# Patient Record
Sex: Male | Born: 2000 | Race: Black or African American | Hispanic: No | Marital: Single | Smoking: Never smoker
Health system: Southern US, Community
[De-identification: ages and names within clinical notes are randomized; demographics above are authoritative.]

---
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
## 2019-03-21 IMAGING — DX DG ANKLE COMPLETE 3+V*L*
3 series · 3 of 3 positions shown · non-contrast
Comparison: None.

CLINICAL DATA: Twisting injury with pain at the lateral malleolus

EXAM:
LEFT ANKLE COMPLETE - 3+ VIEW

[ankle ap]
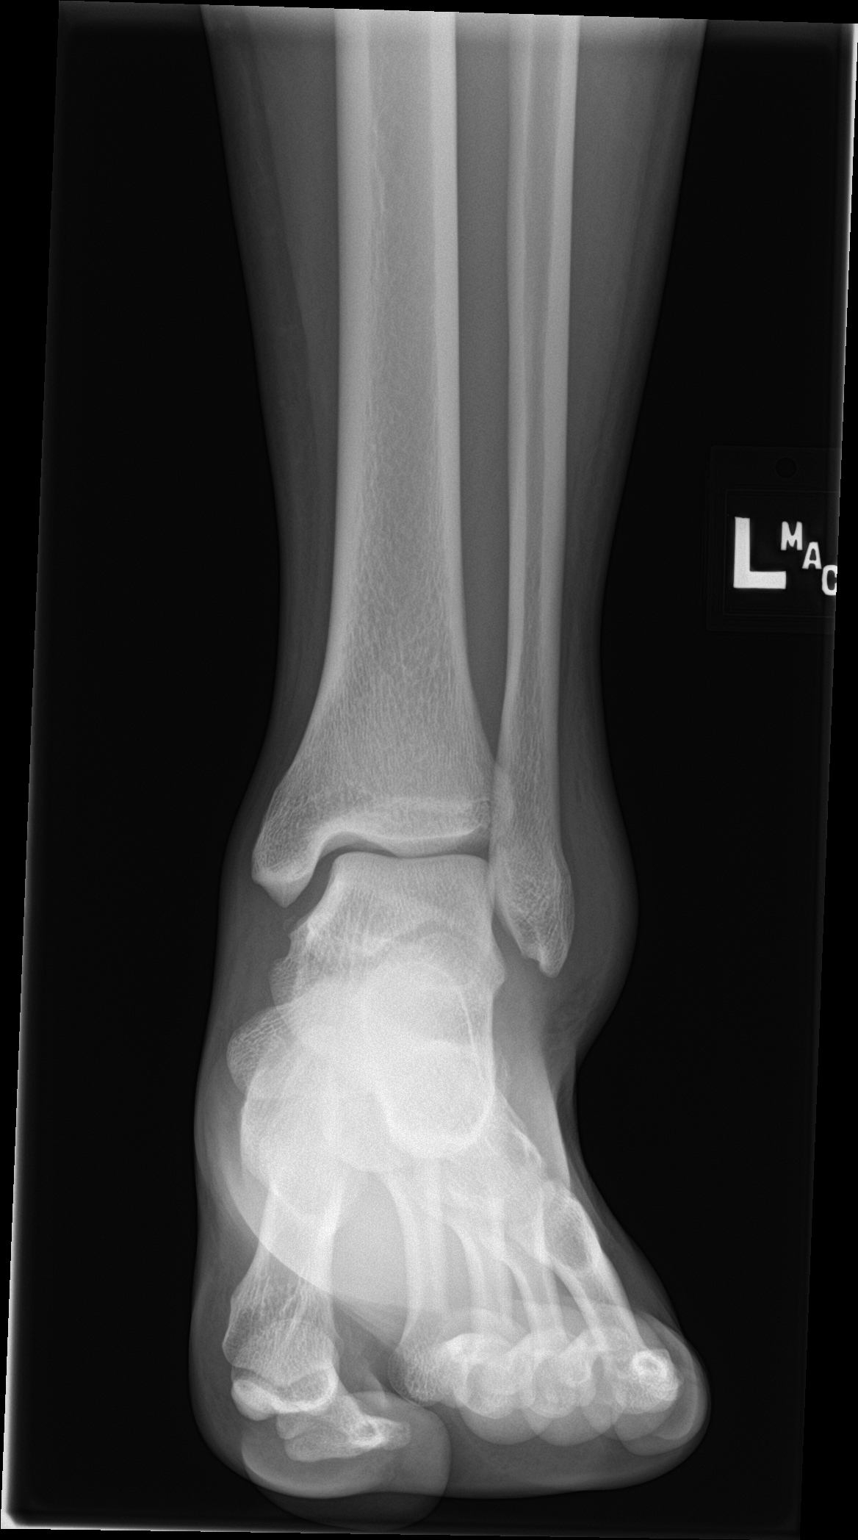

[ankle obl]
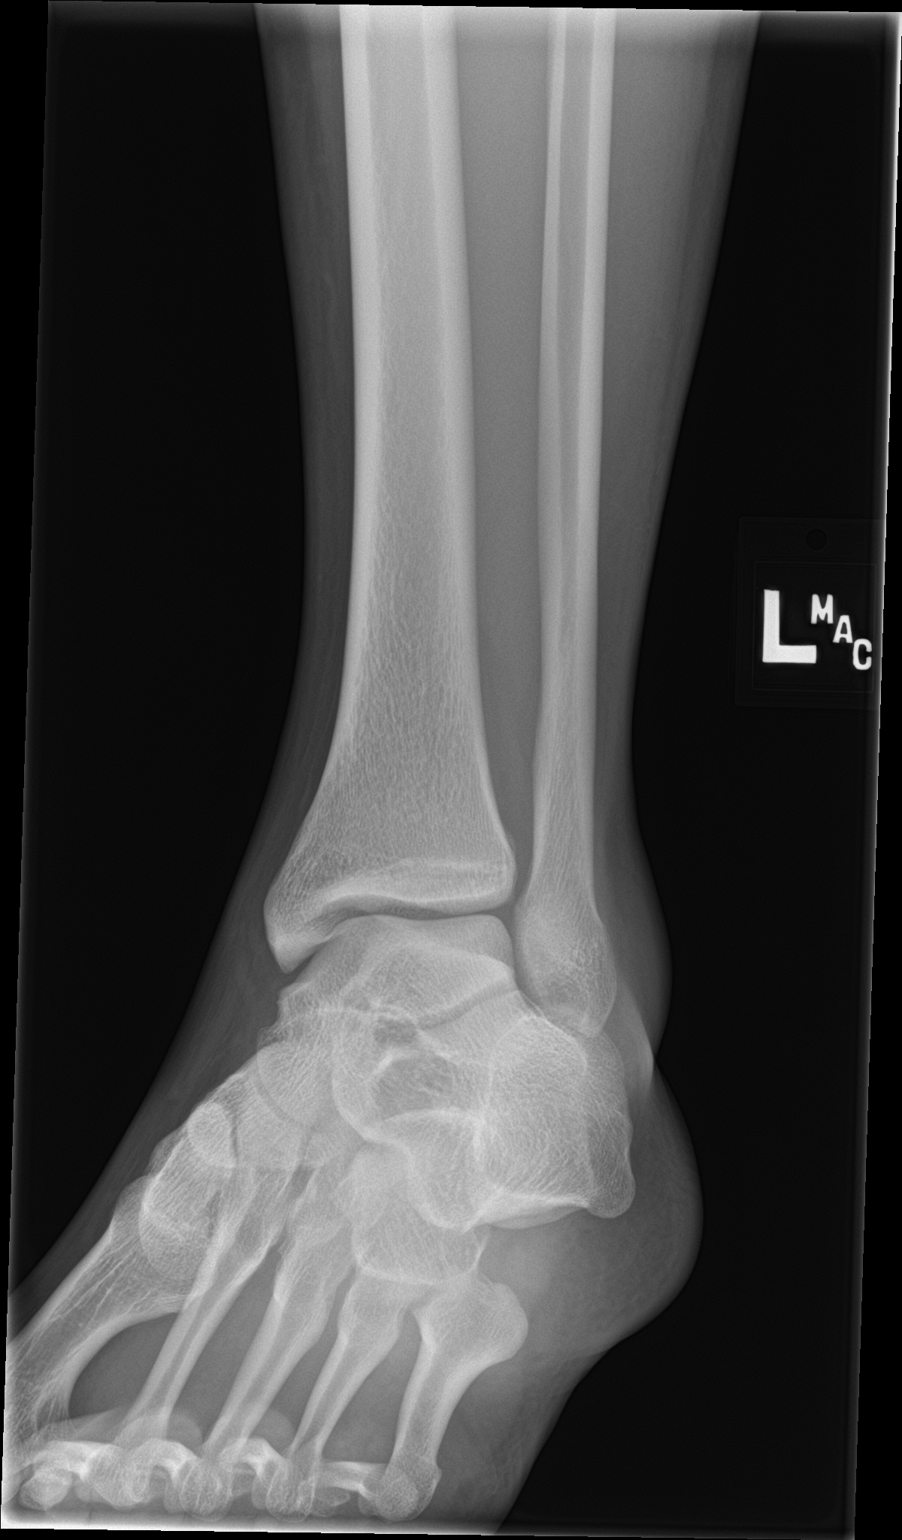

[ankle lat]
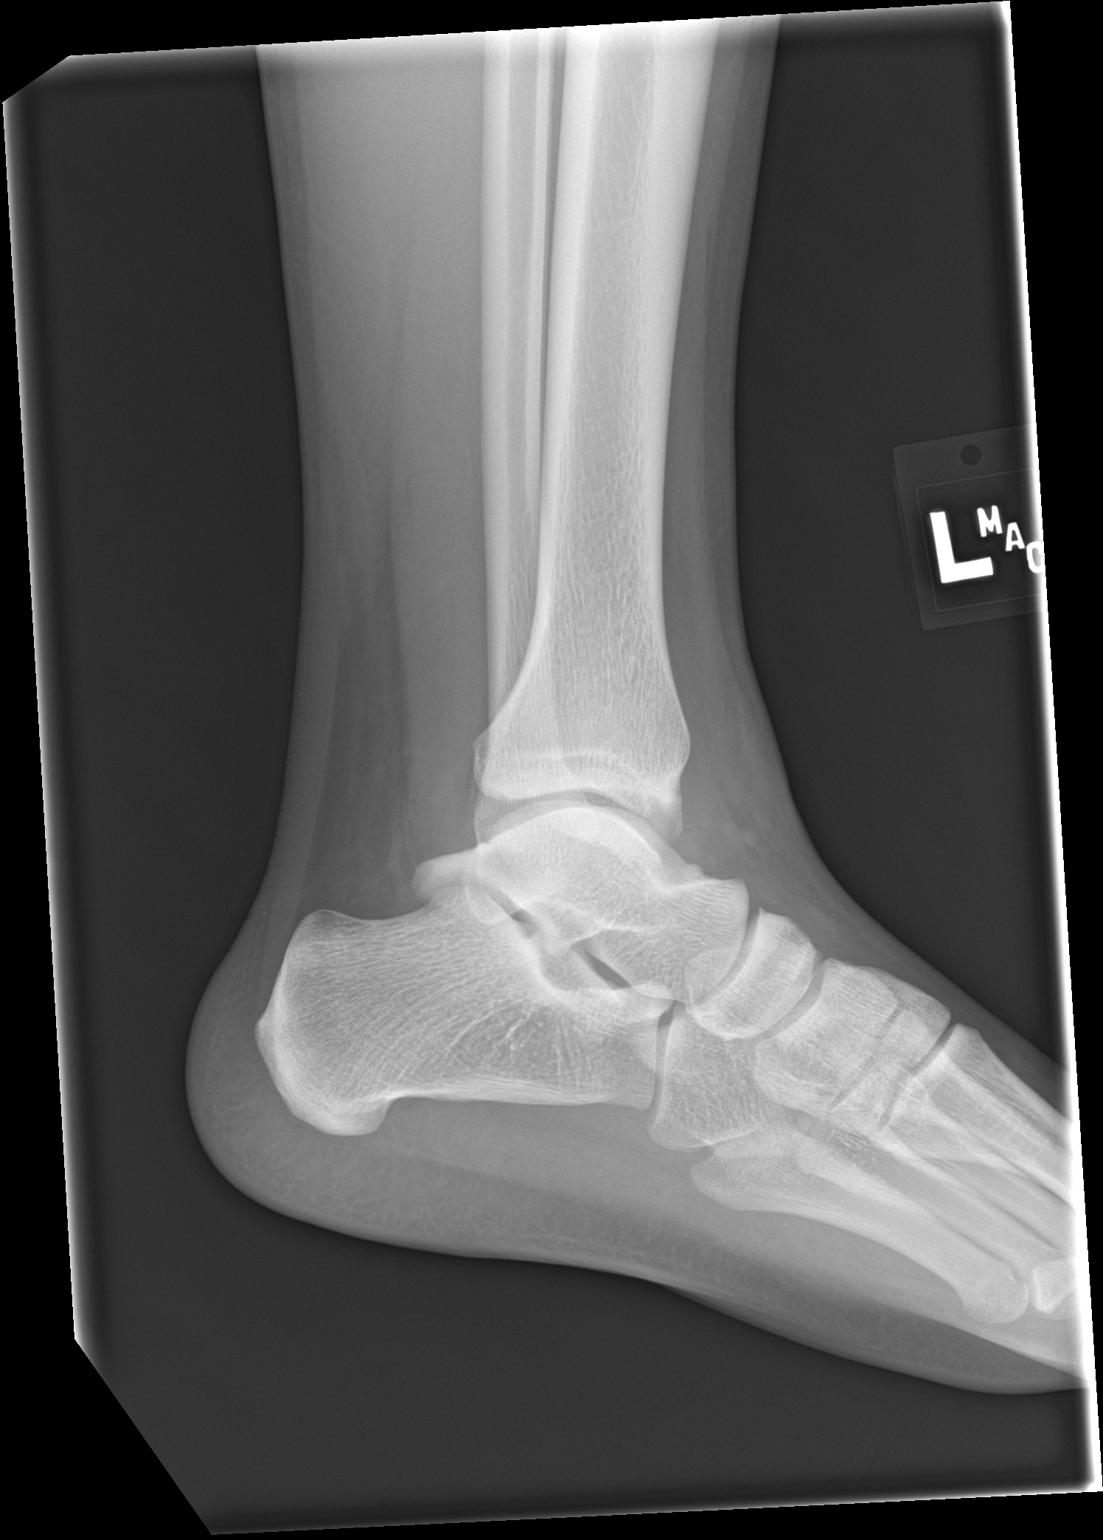

[3 of 3 positions shown; findings below may reference images not displayed]

FINDINGS: Significant lateral soft tissue swelling. Ankle mortise is
symmetric. Suspected acute cortical avulsion off the inferomedial
talus.
IMPRESSION: 1. Marked lateral soft tissue swelling
2. Suspected acute cortical avulsion fracture off the infero medial
talus

## 2023-05-12 ENCOUNTER — Emergency Department (HOSPITAL_COMMUNITY)
Admission: EM | Admit: 2023-05-12 | Discharge: 2023-05-12 | Disposition: A | Discharge status: Home / Self Care | Payer: No Typology Code available for payment source | Attending: Emergency Medicine | Admitting: Emergency Medicine

## 2023-05-12 ENCOUNTER — Other Ambulatory Visit: Payer: Self-pay

## 2023-05-12 DIAGNOSIS — M7918 Myalgia, other site: Secondary | ICD-10-CM

## 2023-05-12 DIAGNOSIS — Y92410 Unspecified street and highway as the place of occurrence of the external cause: Secondary | ICD-10-CM | POA: Diagnosis not present

## 2023-05-12 DIAGNOSIS — R0781 Pleurodynia: Secondary | ICD-10-CM | POA: Diagnosis present
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
## 2023-05-12 NOTE — Discharge Instructions (Signed)
Motrin and Tylenol as needed directed for pain.  Warm compresses to sore muscles and follow gentle stretching.  Return to ER for worsening or concerning symptoms.
